# Patient Record
Sex: Female | Born: 1935 | Hispanic: Yes | Marital: Single
Health system: Southern US, Community
[De-identification: ages and names within clinical notes are randomized; demographics above are authoritative.]

---

## 2018-11-01 ENCOUNTER — Observation Stay (HOSPITAL_COMMUNITY)
Admission: EM | Admit: 2018-11-01 | Discharge: 2018-11-03 | Disposition: A | Discharge status: Home / Self Care | Payer: PRIVATE HEALTH INSURANCE | Attending: Internal Medicine | Admitting: Internal Medicine

## 2018-11-01 ENCOUNTER — Emergency Department (HOSPITAL_COMMUNITY): Payer: PRIVATE HEALTH INSURANCE

## 2018-11-01 ENCOUNTER — Other Ambulatory Visit: Payer: Self-pay

## 2018-11-01 DIAGNOSIS — Z79899 Other long term (current) drug therapy: Secondary | ICD-10-CM | POA: Insufficient documentation

## 2018-11-01 DIAGNOSIS — I10 Essential (primary) hypertension: Secondary | ICD-10-CM | POA: Insufficient documentation

## 2018-11-01 DIAGNOSIS — A419 Sepsis, unspecified organism: Secondary | ICD-10-CM | POA: Diagnosis present

## 2018-11-01 DIAGNOSIS — I129 Hypertensive chronic kidney disease with stage 1 through stage 4 chronic kidney disease, or unspecified chronic kidney disease: Secondary | ICD-10-CM | POA: Diagnosis not present

## 2018-11-01 DIAGNOSIS — G309 Alzheimer's disease, unspecified: Secondary | ICD-10-CM | POA: Insufficient documentation

## 2018-11-01 DIAGNOSIS — N183 Chronic kidney disease, stage 3 (moderate): Secondary | ICD-10-CM | POA: Diagnosis not present

## 2018-11-01 DIAGNOSIS — M109 Gout, unspecified: Secondary | ICD-10-CM | POA: Diagnosis not present

## 2018-11-01 DIAGNOSIS — E785 Hyperlipidemia, unspecified: Secondary | ICD-10-CM | POA: Diagnosis not present

## 2018-11-01 DIAGNOSIS — Z1159 Encounter for screening for other viral diseases: Secondary | ICD-10-CM | POA: Insufficient documentation

## 2018-11-01 DIAGNOSIS — F0280 Dementia in other diseases classified elsewhere without behavioral disturbance: Secondary | ICD-10-CM | POA: Diagnosis not present

## 2018-11-01 DIAGNOSIS — I700 Atherosclerosis of aorta: Secondary | ICD-10-CM | POA: Diagnosis not present

## 2018-11-01 DIAGNOSIS — Z7901 Long term (current) use of anticoagulants: Secondary | ICD-10-CM | POA: Insufficient documentation

## 2018-11-01 DIAGNOSIS — I6782 Cerebral ischemia: Secondary | ICD-10-CM | POA: Insufficient documentation

## 2018-11-01 DIAGNOSIS — I447 Left bundle-branch block, unspecified: Secondary | ICD-10-CM | POA: Insufficient documentation

## 2018-11-01 DIAGNOSIS — E1122 Type 2 diabetes mellitus with diabetic chronic kidney disease: Secondary | ICD-10-CM | POA: Diagnosis not present

## 2018-11-01 DIAGNOSIS — N390 Urinary tract infection, site not specified: Secondary | ICD-10-CM | POA: Insufficient documentation

## 2018-11-01 DIAGNOSIS — E119 Type 2 diabetes mellitus without complications: Secondary | ICD-10-CM | POA: Insufficient documentation

## 2018-11-01 DIAGNOSIS — R2681 Unsteadiness on feet: Secondary | ICD-10-CM | POA: Insufficient documentation

## 2018-11-01 LAB — CBC WITH DIFFERENTIAL/PLATELET
Abs Immature Granulocytes: 0.11 10*3/uL — ABNORMAL HIGH (ref 0.00–0.07)
Basophils Absolute: 0 10*3/uL (ref 0.0–0.1)
Basophils Relative: 0 %
Eosinophils Absolute: 0 10*3/uL (ref 0.0–0.5)
Eosinophils Relative: 0 %
HCT: 38.3 % (ref 36.0–46.0)
Hemoglobin: 12 g/dL (ref 12.0–15.0)
Immature Granulocytes: 1 %
Lymphocytes Relative: 4 %
Lymphs Abs: 0.7 10*3/uL (ref 0.7–4.0)
MCH: 27.5 pg (ref 26.0–34.0)
MCHC: 31.3 g/dL (ref 30.0–36.0)
MCV: 87.8 fL (ref 80.0–100.0)
Monocytes Absolute: 1.3 10*3/uL — ABNORMAL HIGH (ref 0.1–1.0)
Monocytes Relative: 8 %
Neutro Abs: 14.2 10*3/uL — ABNORMAL HIGH (ref 1.7–7.7)
Neutrophils Relative %: 87 %
Platelets: 232 10*3/uL (ref 150–400)
RBC: 4.36 MIL/uL (ref 3.87–5.11)
RDW: 13.4 % (ref 11.5–15.5)
WBC: 16.3 10*3/uL — ABNORMAL HIGH (ref 4.0–10.5)
nRBC: 0 % (ref 0.0–0.2)

## 2018-11-01 LAB — URINALYSIS, ROUTINE W REFLEX MICROSCOPIC
Bilirubin Urine: NEGATIVE
Glucose, UA: NEGATIVE mg/dL
Ketones, ur: NEGATIVE mg/dL
Nitrite: NEGATIVE
Protein, ur: NEGATIVE mg/dL
Specific Gravity, Urine: <1.005 — ABNORMAL LOW (ref 1.005–1.030)
pH: 6.5 (ref 5.0–8.0)

## 2018-11-01 LAB — URINALYSIS, MICROSCOPIC (REFLEX)

## 2018-11-01 LAB — LIPASE, BLOOD: Lipase: 58 U/L — ABNORMAL HIGH (ref 11–51)

## 2018-11-01 LAB — COMPREHENSIVE METABOLIC PANEL
ALT: 23 U/L (ref 0–44)
AST: 27 U/L (ref 15–41)
Albumin: 3.6 g/dL (ref 3.5–5.0)
Alkaline Phosphatase: 114 U/L (ref 38–126)
Anion gap: 11 (ref 5–15)
BUN: 15 mg/dL (ref 8–23)
CO2: 21 mmol/L — ABNORMAL LOW (ref 22–32)
Calcium: 8.7 mg/dL — ABNORMAL LOW (ref 8.9–10.3)
Chloride: 102 mmol/L (ref 98–111)
Creatinine, Ser: 0.91 mg/dL (ref 0.44–1.00)
GFR calc Af Amer: >60 mL/min (ref >60)
GFR calc non Af Amer: 58 mL/min — ABNORMAL LOW (ref >60)
Glucose, Bld: 132 mg/dL — ABNORMAL HIGH (ref 70–99)
Potassium: 3.9 mmol/L (ref 3.5–5.1)
Sodium: 134 mmol/L — ABNORMAL LOW (ref 135–145)
Total Bilirubin: 0.4 mg/dL (ref 0.3–1.2)
Total Protein: 7 g/dL (ref 6.5–8.1)

## 2018-11-01 LAB — SARS CORONAVIRUS 2 BY RT PCR (HOSPITAL ORDER, PERFORMED IN ~~LOC~~ HOSPITAL LAB): SARS Coronavirus 2: NEGATIVE

## 2018-11-01 LAB — LACTIC ACID, PLASMA: Lactic Acid, Venous: 2.1 mmol/L (ref 0.5–1.9)

## 2018-11-01 MED ORDER — ACETAMINOPHEN 650 MG RE SUPP
650.0000 mg | Freq: Once | RECTAL | Status: DC
  Filled 2018-11-01: qty 1

## 2018-11-01 MED ORDER — LACTATED RINGERS IV BOLUS
1000.0000 mL | Freq: Once | INTRAVENOUS | Status: AC
  Administered 2018-11-01: 1000 mL via INTRAVENOUS

## 2018-11-01 MED ORDER — ACETAMINOPHEN 325 MG PO TABS
650.0000 mg | ORAL_TABLET | Freq: Once | ORAL | Status: AC
  Administered 2018-11-01: 650 mg via ORAL
  Filled 2018-11-01: qty 2

## 2018-11-01 MED ORDER — SODIUM CHLORIDE 0.9 % IV SOLN
1.0000 g | Freq: Once | INTRAVENOUS | Status: AC
  Administered 2018-11-01: 1 g via INTRAVENOUS
  Filled 2018-11-01: qty 10

## 2018-11-01 MED ORDER — SODIUM CHLORIDE 0.9 % IV SOLN
500.0000 mg | Freq: Once | INTRAVENOUS | Status: AC
  Administered 2018-11-01: 500 mg via INTRAVENOUS
  Filled 2018-11-01: qty 500

## 2018-11-01 NOTE — ED Notes (Signed)
discared blood cultures,  Tech collected 2nd set.

## 2018-11-01 NOTE — ED Triage Notes (Signed)
Pt brought in by EMS with c/o of CP earlier today.  Pt reports dizziness and fever.  Per EMS temp of 101.6.  Pt has hx of confusion

## 2018-11-01 NOTE — ED Provider Notes (Signed)
[facility] Provider Note   CSN: [id number] Arrival date & time: 11/01/18  1955    History   Chief Complaint Chief Complaint  Patient presents with  . Chest Pain  . Fever    HPI Nancy Griffin is a 85 y.o. female.     The history is provided by the patient.  Fever Temp source:  Subjective Severity:  Mild Onset quality:  Gradual Timing:  Constant Progression:  Unchanged Chronicity:  New Relieved by:  Nothing Worsened by:  Nothing Associated symptoms: confusion and dysuria (maybe)   Associated symptoms: no chest pain, no chills, no cough, no ear pain, no rash, no sore throat and no vomiting   Risk factors: no sick contacts     No past medical history on file.  There are no active problems to display for this patient.     OB History   No obstetric history on file.      Home Medications    Prior to Admission medications   Not on File    Family History No family history on file.  Social History Social History   Tobacco Use  . Smoking status: Not on file  Substance Use Topics  . Alcohol use: Not on file  . Drug use: Not on file     Allergies   Patient has no allergy information on record.   Review of Systems Review of Systems  Constitutional: Positive for fever. Negative for chills.  HENT: Negative for ear pain and sore throat.   Eyes: Negative for pain and visual disturbance.  Respiratory: Negative for cough and shortness of breath.   Cardiovascular: Negative for chest pain and palpitations.  Gastrointestinal: Negative for abdominal pain and vomiting.  Genitourinary: Positive for dysuria (maybe). Negative for hematuria.  Musculoskeletal: Negative for arthralgias and back pain.  Skin: Negative for color change and rash.  Neurological: Positive for weakness. Negative for seizures and syncope.  Psychiatric/Behavioral: Positive for confusion.  All other systems reviewed and are negative.     Physical Exam Updated Vital Signs  ED Triage Vitals  Enc Vitals Group     BP 11/01/18 2016 134/75     Pulse Rate 11/01/18 2016 86     Resp 11/01/18 2016 20     Temp 11/01/18 2013 (!) 102.4 F (39.1 C)     Temp Source 11/01/18 2013 Oral     SpO2 11/01/18 2016 96 %     Weight --      Height --      Head Circumference --      Peak Flow --      Pain Score --      Pain Loc --      Pain Edu? --      Excl. in GC? --     Physical Exam Vitals signs and nursing note reviewed.  Constitutional:      General: She is not in acute distress.    Appearance: She is well-developed. She is not ill-appearing.  HENT:     Head: Normocephalic and atraumatic.  Eyes:     Conjunctiva/sclera: Conjunctivae normal.     Pupils: Pupils are equal, round, and reactive to light.  Neck:     Musculoskeletal: Normal range of motion and neck supple.  Cardiovascular:     Rate and Rhythm: Normal rate and regular rhythm.     Pulses:          Radial pulses are 2+ on the right side and  2+ on the left side.     Heart sounds: Normal heart sounds. No murmur.  Pulmonary:     Effort: Pulmonary effort is normal. No respiratory distress.     Breath sounds: Normal breath sounds. No decreased breath sounds, wheezing or rhonchi.  Abdominal:     Palpations: Abdomen is soft.     Tenderness: There is no abdominal tenderness.  Musculoskeletal:     Right lower leg: No edema.     Left lower leg: No edema.  Skin:    General: Skin is warm and dry.     Capillary Refill: Capillary refill takes less than 2 seconds.  Neurological:     General: No focal deficit present.     Mental Status: She is alert.     Comments: Mildly confused      ED Treatments / Results  Labs (all labs ordered are listed, but only abnormal results are displayed) Labs Reviewed  LACTIC ACID, PLASMA - Abnormal; Notable for the following components:      Result Value   Lactic Acid, Venous 2.1 (*)    All other components within normal limits   COMPREHENSIVE METABOLIC PANEL - Abnormal; Notable for the following components:   Sodium 134 (*)    CO2 21 (*)    Glucose, Bld 132 (*)    Calcium 8.7 (*)    GFR calc non Af Amer 58 (*)    All other components within normal limits  CBC WITH DIFFERENTIAL/PLATELET - Abnormal; Notable for the following components:   WBC 16.3 (*)    Neutro Abs 14.2 (*)    Monocytes Absolute 1.3 (*)    Abs Immature Granulocytes 0.11 (*)    All other components within normal limits  URINALYSIS, ROUTINE W REFLEX MICROSCOPIC - Abnormal; Notable for the following components:   APPearance HAZY (*)    Specific Gravity, Urine <1.005 (*)    Hgb urine dipstick TRACE (*)    Leukocytes,Ua MODERATE (*)    All other components within normal limits  LIPASE, BLOOD - Abnormal; Notable for the following components:   Lipase 58 (*)    All other components within normal limits  URINALYSIS, MICROSCOPIC (REFLEX) - Abnormal; Notable for the following components:   Bacteria, UA RARE (*)    All other components within normal limits  SARS CORONAVIRUS 2 (HOSPITAL ORDER, [place] [facility])  CULTURE, BLOOD (ROUTINE X 2)  CULTURE, BLOOD (ROUTINE X 2)  URINE CULTURE  LACTIC ACID, PLASMA    EKG EKG Interpretation  Date/Time:  Saturday November 01 2018 20:09:14 EDT Ventricular Rate:  92 PR Interval:    QRS Duration: 130 QT Interval:  367 QTC Calculation: 454 R Axis:   -53 Text Interpretation:  Sinus rhythm Borderline prolonged PR interval Left bundle branch block Confirmed by Lennice Sites [phone]) on 11/01/2018 8:13:25 PM   Radiology Dg Chest Portable 1 View  Result Date: 11/01/2018 CLINICAL DATA:  Fever EXAM: PORTABLE CHEST 1 VIEW COMPARISON:  None. FINDINGS: No focal airspace disease or effusion. Mild cardiomegaly with aortic atherosclerosis. No pneumothorax. IMPRESSION: No active disease.  Cardiomegaly Electronically Signed   By: Donavan Foil M.D.   On: 11/01/2018 23:24    Procedures .Critical Care  Performed by: Lennice Sites, DO Authorized by: Lennice Sites, DO   Critical care provider statement:    Critical care time (minutes):  35   Critical care was necessary to treat or prevent imminent or life-threatening deterioration of the following conditions:  Sepsis  Critical care was time spent personally by me on the following activities:  Blood draw for specimens, development of treatment plan with patient or surrogate, discussions with primary provider, evaluation of patient's response to treatment, examination of patient, obtaining history from patient or surrogate, ordering and performing treatments and interventions, ordering and review of laboratory studies, ordering and review of radiographic studies, pulse oximetry, re-evaluation of patient's condition and review of old charts   I assumed direction of critical care for this patient from another provider in my specialty: no     (including critical care time)  Medications Ordered in ED Medications  acetaminophen (TYLENOL) suppository 650 mg (650 mg Rectal Not Given 11/01/18 2302)  lactated ringers bolus 1,000 mL (0 mLs Intravenous Stopped 11/01/18 2302)  cefTRIAXone (ROCEPHIN) 1 g in sodium chloride 0.9 % 100 mL IVPB (0 g Intravenous Stopped 11/01/18 2302)  azithromycin (ZITHROMAX) 500 mg in sodium chloride 0.9 % 250 mL IVPB (500 mg Intravenous New Bag/Given 11/01/18 2301)  acetaminophen (TYLENOL) tablet 650 mg (650 mg Oral Given 11/01/18 2139)     Initial Impression / Assessment and Plan / ED Course  I have reviewed the triage vital signs and the nursing notes.  Pertinent labs & imaging results that were available during my care of the patient were reviewed by me and considered in my medical decision making (see chart for details).     Minette HeadlandJosephina Therrell is an 85 year old female with history of possible heart history who presents the ED with weakness, fever, confusion.  Patient with temperature of 102.4.  Mild tachypnea.  Otherwise  normal vitals.  Patient found with a white count of 16.  Code sepsis was initiated.  Patient given IV Rocephin, IV Zithromax.  Patient is Spanish-speaking and interpreter was used.  However patient was little lethargic and difficult to interview at first.  Family member states that fever started today.  Possible UTI.  Did not have any cough.  No concern for coronavirus at home.  Patient with negative coronavirus test.  Urinalysis looks like a UTI.  Chest x-ray with no signs of pneumonia.  Patient with improved mentation following IV fluids and Tylenol.  Lactic acid was 2.1.  Concern for sepsis.  Patient to be admitted to medicine service for further sepsis care.  Improved following IV fluids, Tylenol, antibiotics.  This chart was dictated using voice recognition software.  Despite best efforts to proofread,  errors can occur which can change the documentation meaning.    Final Clinical Impressions(s) / ED Diagnoses   Final diagnoses:  Sepsis, due to unspecified organism, unspecified whether acute organ dysfunction present [facility](HCC)  Urinary tract infection without hematuria, site unspecified    ED Discharge Orders    None       Virgina NorfolkCuratolo, Ashlley Booher, DO 11/02/18 0014

## 2018-11-01 NOTE — ED Triage Notes (Signed)
Pt is very poor historian due to dementia

## 2018-11-02 ENCOUNTER — Observation Stay (HOSPITAL_COMMUNITY): Payer: PRIVATE HEALTH INSURANCE

## 2018-11-02 DIAGNOSIS — N390 Urinary tract infection, site not specified: Secondary | ICD-10-CM | POA: Diagnosis present

## 2018-11-02 DIAGNOSIS — I10 Essential (primary) hypertension: Secondary | ICD-10-CM

## 2018-11-02 DIAGNOSIS — E119 Type 2 diabetes mellitus without complications: Secondary | ICD-10-CM

## 2018-11-02 DIAGNOSIS — I447 Left bundle-branch block, unspecified: Secondary | ICD-10-CM

## 2018-11-02 DIAGNOSIS — A419 Sepsis, unspecified organism: Secondary | ICD-10-CM

## 2018-11-02 LAB — CBC
HCT: 35.8 % — ABNORMAL LOW (ref 36.0–46.0)
Hemoglobin: 11 g/dL — ABNORMAL LOW (ref 12.0–15.0)
MCH: 27 pg (ref 26.0–34.0)
MCHC: 30.7 g/dL (ref 30.0–36.0)
MCV: 88 fL (ref 80.0–100.0)
Platelets: 223 10*3/uL (ref 150–400)
RBC: 4.07 MIL/uL (ref 3.87–5.11)
RDW: 13.7 % (ref 11.5–15.5)
WBC: 14.9 10*3/uL — ABNORMAL HIGH (ref 4.0–10.5)
nRBC: 0 % (ref 0.0–0.2)

## 2018-11-02 LAB — LACTIC ACID, PLASMA: Lactic Acid, Venous: 1.2 mmol/L (ref 0.5–1.9)

## 2018-11-02 LAB — BASIC METABOLIC PANEL
Anion gap: 8 (ref 5–15)
BUN: 12 mg/dL (ref 8–23)
CO2: 23 mmol/L (ref 22–32)
Calcium: 8.5 mg/dL — ABNORMAL LOW (ref 8.9–10.3)
Chloride: 110 mmol/L (ref 98–111)
Creatinine, Ser: 0.83 mg/dL (ref 0.44–1.00)
GFR calc Af Amer: >60 mL/min (ref >60)
GFR calc non Af Amer: >60 mL/min (ref >60)
Glucose, Bld: 115 mg/dL — ABNORMAL HIGH (ref 70–99)
Potassium: 3.9 mmol/L (ref 3.5–5.1)
Sodium: 141 mmol/L (ref 135–145)

## 2018-11-02 LAB — TROPONIN I (HIGH SENSITIVITY): Troponin I (High Sensitivity): 14 ng/L (ref <18)

## 2018-11-02 LAB — GLUCOSE, CAPILLARY
Glucose-Capillary: 107 mg/dL — ABNORMAL HIGH (ref 70–99)
Glucose-Capillary: 102 mg/dL — ABNORMAL HIGH (ref 70–99)
Glucose-Capillary: 94 mg/dL (ref 70–99)
Glucose-Capillary: 99 mg/dL (ref 70–99)

## 2018-11-02 LAB — HEMOGLOBIN A1C
Hgb A1c MFr Bld: 5.4 % (ref 4.8–5.6)
Mean Plasma Glucose: 108.28 mg/dL

## 2018-11-02 LAB — PROTIME-INR
INR: 2.4 — ABNORMAL HIGH (ref 0.8–1.2)
Prothrombin Time: 26.1 seconds — ABNORMAL HIGH (ref 11.4–15.2)

## 2018-11-02 MED ORDER — ASPIRIN EC 81 MG PO TBEC
81.0000 mg | DELAYED_RELEASE_TABLET | Freq: Every day | ORAL | Status: DC
  Administered 2018-11-02 – 2018-11-03 (×2): 81 mg via ORAL
  Filled 2018-11-02 (×2): qty 1

## 2018-11-02 MED ORDER — WARFARIN - PHARMACIST DOSING INPATIENT
Freq: Every day | Status: DC
  Administered 2018-11-02: 18:00:00

## 2018-11-02 MED ORDER — SODIUM CHLORIDE 0.9 % IV SOLN
INTRAVENOUS | Status: DC
  Administered 2018-11-02: 03:00:00 via INTRAVENOUS

## 2018-11-02 MED ORDER — MIRTAZAPINE 15 MG PO TABS
15.0000 mg | ORAL_TABLET | Freq: Every day | ORAL | Status: DC
  Administered 2018-11-02: 15 mg via ORAL
  Filled 2018-11-02: qty 1

## 2018-11-02 MED ORDER — ACETAMINOPHEN 325 MG PO TABS: 650.0000 mg | ORAL_TABLET | Freq: Four times a day (QID) | ORAL | Status: DC | PRN

## 2018-11-02 MED ORDER — ENOXAPARIN SODIUM 40 MG/0.4ML ~~LOC~~ SOLN: 40.0000 mg | Freq: Every day | SUBCUTANEOUS | Status: DC

## 2018-11-02 MED ORDER — INSULIN ASPART 100 UNIT/ML ~~LOC~~ SOLN: 0.0000 [IU] | Freq: Every day | SUBCUTANEOUS | Status: DC

## 2018-11-02 MED ORDER — ACETAMINOPHEN 650 MG RE SUPP: 650.0000 mg | Freq: Four times a day (QID) | RECTAL | Status: DC | PRN

## 2018-11-02 MED ORDER — MEMANTINE HCL ER 28 MG PO CP24
28.0000 mg | ORAL_CAPSULE | Freq: Every day | ORAL | Status: DC
  Administered 2018-11-02 – 2018-11-03 (×2): 28 mg via ORAL
  Filled 2018-11-02 (×2): qty 1

## 2018-11-02 MED ORDER — DONEPEZIL HCL 5 MG PO TABS
5.0000 mg | ORAL_TABLET | Freq: Every day | ORAL | Status: DC
  Administered 2018-11-02: 5 mg via ORAL
  Filled 2018-11-02: qty 1

## 2018-11-02 MED ORDER — SODIUM CHLORIDE 0.9 % IV SOLN
1.0000 g | INTRAVENOUS | Status: DC
  Administered 2018-11-03: 1 g via INTRAVENOUS
  Filled 2018-11-02: qty 10

## 2018-11-02 MED ORDER — GALANTAMINE HYDROBROMIDE 4 MG PO TABS
4.0000 mg | ORAL_TABLET | Freq: Two times a day (BID) | ORAL | Status: DC
  Filled 2018-11-02: qty 1

## 2018-11-02 MED ORDER — PANTOPRAZOLE SODIUM 40 MG PO TBEC
40.0000 mg | DELAYED_RELEASE_TABLET | Freq: Every day | ORAL | Status: DC
  Administered 2018-11-02 – 2018-11-03 (×2): 40 mg via ORAL
  Filled 2018-11-02 (×2): qty 1

## 2018-11-02 MED ORDER — QUETIAPINE FUMARATE 25 MG PO TABS
25.0000 mg | ORAL_TABLET | Freq: Every evening | ORAL | Status: DC
  Administered 2018-11-02: 25 mg via ORAL
  Filled 2018-11-02: qty 1

## 2018-11-02 MED ORDER — INSULIN ASPART 100 UNIT/ML ~~LOC~~ SOLN: 0.0000 [IU] | Freq: Three times a day (TID) | SUBCUTANEOUS | Status: DC

## 2018-11-02 MED ORDER — WARFARIN SODIUM 3 MG PO TABS
3.0000 mg | ORAL_TABLET | Freq: Once | ORAL | Status: AC
  Administered 2018-11-02: 3 mg via ORAL
  Filled 2018-11-02: qty 1

## 2018-11-02 NOTE — Progress Notes (Signed)
Urine culture was obtained for patient and sent to the lab.

## 2018-11-02 NOTE — Evaluation (Signed)
Physical Therapy Evaluation Patient Details Name: Nancy Griffin MRN: [id number] DOB: 1931-09-04 Today's Date: 11/02/2018   History of Present Illness  Nancy Griffin is a 85 y.o.spanish speaking female with no past medical history on file presenting to the hospital via EMS for evaluation of fever and confusion.  Presumed to have UTI.   Clinical Impression  Pt admitted with above diagnosis. Pt currently with functional limitations due to the deficits listed below (see PT Problem List). Pt was able to ambulate without device and with fairly steady gait overall although at times pt reaching for furniture.  Given confusion, if she has 24 hour care, will be safe to go home but if she does not may need SNF.  She was unaware of month and year as well as the fact that she was in hospital.  Will follow acutely.  Pt will benefit from skilled PT to increase their independence and safety with mobility to allow discharge to the venue listed below.      Follow Up Recommendations Home health PT;Supervision/Assistance - 24 hour(If pt does not have 24 hour care, may need SNF)    Equipment Recommendations  Rolling walker with 5" wheels    Recommendations for Other Services       Precautions / Restrictions Precautions Precautions: Fall Restrictions Weight Bearing Restrictions: No      Mobility  Bed Mobility Overal bed mobility: Independent                Transfers Overall transfer level: Independent                  Ambulation/Gait Ambulation/Gait assistance: Min guard Gait Distance (Feet): 150 Feet Assistive device: None Gait Pattern/deviations: Step-through pattern;Decreased stride length   Gait velocity interpretation: <1.31 ft/sec, indicative of household ambulator General Gait Details: Pt generally steady at times reaching for furniture in room.  Feel that confusion hinders pts safety.    Stairs            Wheelchair Mobility    Modified Rankin (Stroke  Patients Only)       Balance Overall balance assessment: Needs assistance Sitting-balance support: No upper extremity supported;Feet supported Sitting balance-Leahy Scale: Fair     Standing balance support: No upper extremity supported;During functional activity Standing balance-Leahy Scale: Fair Standing balance comment: can stand statically and wiped self after using bathroom as well as able to stand and wash hands at sink                             Pertinent Vitals/Pain Pain Assessment: No/denies pain    Home Living Family/patient expects to be discharged to:: Private residence Living Arrangements: Spouse/significant other Available Help at Discharge: Family;Available 24 hours/day Type of Home: Apartment Home Access: Level entry     Home Layout: One level Home Equipment: None      Prior Function Level of Independence: Independent               Hand Dominance        Extremity/Trunk Assessment   Upper Extremity Assessment Upper Extremity Assessment: Defer to OT evaluation    Lower Extremity Assessment Lower Extremity Assessment: Generalized weakness    Cervical / Trunk Assessment Cervical / Trunk Assessment: Normal  Communication   Communication: Prefers language other than English(Interpreter Jomarie LongsJoseph [phone])  Cognition Arousal/Alertness: Awake/alert Behavior During Therapy: WFL for tasks assessed/performed Overall Cognitive Status: History of cognitive impairments - at baseline Area of Impairment: Orientation;Safety/judgement;Problem  solving                 Orientation Level: Disoriented to;Place;Time;Situation       Safety/Judgement: Decreased awareness of safety;Decreased awareness of deficits   Problem Solving: Slow processing;Decreased initiation General Comments: Pt did not know she was in hospital or the month or year.       General Comments      Exercises     Assessment/Plan    PT Assessment Patient needs  continued PT services  PT Problem List Decreased activity tolerance;Decreased balance;Decreased mobility;Decreased knowledge of use of DME;Decreased safety awareness;Decreased knowledge of precautions       PT Treatment Interventions DME instruction;Gait training;Functional mobility training;Therapeutic activities;Therapeutic exercise;Balance training;Patient/family education    PT Goals (Current goals can be found in the Care Plan section)  Acute Rehab PT Goals Patient Stated Goal: to go home PT Goal Formulation: With patient Time For Goal Achievement: 11/16/18 Potential to Achieve Goals: Good    Frequency Min 3X/week   Barriers to discharge        Co-evaluation               AM-PAC PT "6 Clicks" Mobility  Outcome Measure Help needed turning from your back to your side while in a flat bed without using bedrails?: None Help needed moving from lying on your back to sitting on the side of a flat bed without using bedrails?: None Help needed moving to and from a bed to a chair (including a wheelchair)?: None Help needed standing up from a chair using your arms (e.g., wheelchair or bedside chair)?: None Help needed to walk in hospital room?: A Little Help needed climbing 3-5 steps with a railing? : A Little 6 Click Score: 22    End of Session Equipment Utilized During Treatment: Gait belt Activity Tolerance: Patient tolerated treatment well Patient left: in chair;with call bell/phone within reach;with chair alarm set Nurse Communication: Mobility status PT Visit Diagnosis: Unsteadiness on feet (R26.81);Muscle weakness (generalized) (M62.81)    Time: 5053-9767 PT Time Calculation (min) (ACUTE ONLY): 25 min   Charges:   PT Evaluation $PT Eval Moderate Complexity: 1 Mod PT Treatments $Gait Training: 8-22 mins        [place] Pager:  [phone]  Office:  [place] 11/02/2018, 3:10 PM

## 2018-11-02 NOTE — ED Notes (Addendum)
Daughter's (speaks english) number is [phone] and would like to be contacted with ANY and ALL updates. Her name is Regino Schultze.

## 2018-11-02 NOTE — Plan of Care (Signed)
Progressing

## 2018-11-02 NOTE — Progress Notes (Addendum)
Patient is having increasing disorientation, needing constant staff supervision for safety.  Patient refusing to use call bell, and wandering around in room.  Patient is unsteady on feet and refusing to use walker for balance.  Also, refusing to sit in chair or lie in bed.  Patient is looking at floor for objects and playing with sheets on bed in a repetitive manner. Notified provider, Dr. Berle Mull, MD; provider made aware of patient behaviors and mental status.

## 2018-11-02 NOTE — Progress Notes (Signed)
Called family (pt's daughter, Regino Schultze) to provide update on pt.  Provide update on patient and answered questions regarding hospital visitation policy.

## 2018-11-02 NOTE — H&P (Addendum)
History and Physical    Atonya Templer UXN:[id number] DOB: [birth year] DOA: 11/01/2018  PCP: Patient, No Pcp Per Patient coming from: Home  Chief Complaint: Fever, confusion  HPI: Bernette Mayers Coia is a 85 y.o.spanish speaking female with no past medical history on file presenting to the hospital via EMS for evaluation of fever and confusion.  Spanish interpreter services used.  History obtained both from the patient and her daughter over the phone.  Patient states she has not been feeling well.  She had a fever and was feeling cold.  Denies any dysuria, urinary frequency, or urgency.  Denies any chest pain, shortness of breath, cough, nausea, vomiting, abdominal pain, or diarrhea.  She has no other complaints.  Daughter states patient is from [place] and is currently living with her here.  Her physician and medical records are in [place].  Daughter states patient has a history of dementia, hypertension, diabetes, and a heart problem for which she takes a blood thinner.  She does not have the list of her medications available at this time.  States patient was more confused today and her speech was incoherent.  She did not notice any facial droop or focal motor weakness.  Patient has not complained of any leg pain.  Daughter states she has seen the patient in the emergency room and feels after receiving treatment for her infection she is back to her baseline.    ED Course: Temperature 102.4 F, remainder vital stable.  COVID-19 rapid test negative.  White count 16.3 with left shift. Lactic acid 2.1. Lipase 58, LFTs normal.  UA with moderate amount of leukocytes, 11-20 WBCs, and rare bacteria.  Urine culture pending.  Blood culture x2 pending.  Chest x-ray showing mild cardiomegaly and no active disease. Patient received 1 L fluid bolus, ceftriaxone, and azithromycin in the ED.  Review of Systems:  All systems reviewed and apart from history of presenting illness, are negative.  Past medical history:  Dementia, hypertension, diabetes, heart problem  Past surgical history: Neck vein surgery  Social history: No tobacco, ethanol, or illicit drug use.  Family history: Father had leukemia.  Prior to Admission medications   Not on File    Physical Exam: Vitals:   11/01/18 2230 11/01/18 2330 11/02/18 0030 11/02/18 0100  BP: 128/77 (!) 107/54 (!) 104/57 (!) 115/52  Pulse: 85 74 71 65  Resp: 20 19 15  (!) 21  Temp:      TempSrc:      SpO2: 92% 96% 95% 96%    Physical Exam  Constitutional: She appears well-developed and well-nourished. No distress.  HENT:  Head: Normocephalic.  Dry mucous membranes  Eyes: Pupils are equal, round, and reactive to light. EOM are normal. Right eye exhibits no discharge. Left eye exhibits no discharge.  Neck: Neck supple.  Cardiovascular: Normal rate, regular rhythm and intact distal pulses.  Pulmonary/Chest: Effort normal and breath sounds normal. No respiratory distress. She has no wheezes. She has no rales.  Abdominal: Soft. Bowel sounds are normal. She exhibits no distension. There is abdominal tenderness. There is guarding.  Musculoskeletal:        General: No edema.     Comments: Area of mild erythema noted on lateral aspect of the right lower extremity in the mid calf region.  No swelling or increased warmth to touch.  Neurological:  Awake and alert. Oriented to self only. Speech fluent, tongue midline, no facial droop. Strength 5 out of 5 in bilateral upper and lower extremities. Sensation  to light touch intact throughout.  Skin: She is not diaphoretic.     Labs on Admission: I have personally reviewed following labs and imaging studies  CBC: Recent Labs  Lab 11/01/18 2011  WBC 16.3*  NEUTROABS 14.2*  HGB 12.0  HCT 38.3  MCV 87.8  PLT 232   Basic Metabolic Panel: Recent Labs  Lab 11/01/18 2011  NA 134*  K 3.9  CL 102  CO2 21*  GLUCOSE 132*  BUN 15  CREATININE 0.91  CALCIUM 8.7*   GFR: CrCl cannot be calculated  (Unknown ideal weight.). Liver Function Tests: Recent Labs  Lab 11/01/18 2011  AST 27  ALT 23  ALKPHOS 114  BILITOT 0.4  PROT 7.0  ALBUMIN 3.6   Recent Labs  Lab 11/01/18 2011  LIPASE 58*   No results for input(s): AMMONIA in the last 168 hours. Coagulation Profile: No results for input(s): INR, PROTIME in the last 168 hours. Cardiac Enzymes: No results for input(s): CKTOTAL, CKMB, CKMBINDEX, TROPONINI in the last 168 hours. BNP (last 3 results) No results for input(s): PROBNP in the last 8760 hours. HbA1C: No results for input(s): HGBA1C in the last 72 hours. CBG: No results for input(s): GLUCAP in the last 168 hours. Lipid Profile: No results for input(s): CHOL, HDL, LDLCALC, TRIG, CHOLHDL, LDLDIRECT in the last 72 hours. Thyroid Function Tests: No results for input(s): TSH, T4TOTAL, FREET4, T3FREE, THYROIDAB in the last 72 hours. Anemia Panel: No results for input(s): VITAMINB12, FOLATE, FERRITIN, TIBC, IRON, RETICCTPCT in the last 72 hours. Urine analysis:    Component Value Date/Time   COLORURINE YELLOW 11/01/2018 2310   APPEARANCEUR HAZY (A) 11/01/2018 2310   LABSPEC <1.005 (L) 11/01/2018 2310   PHURINE 6.5 11/01/2018 2310   GLUCOSEU NEGATIVE 11/01/2018 2310   HGBUR TRACE (A) 11/01/2018 2310   BILIRUBINUR NEGATIVE 11/01/2018 2310   KETONESUR NEGATIVE 11/01/2018 2310   PROTEINUR NEGATIVE 11/01/2018 2310   NITRITE NEGATIVE 11/01/2018 2310   LEUKOCYTESUR MODERATE (A) 11/01/2018 2310    Radiological Exams on Admission: Dg Chest Portable 1 View  Result Date: 11/01/2018 CLINICAL DATA:  Fever EXAM: PORTABLE CHEST 1 VIEW COMPARISON:  None. FINDINGS: No focal airspace disease or effusion. Mild cardiomegaly with aortic atherosclerosis. No pneumothorax. IMPRESSION: No active disease.  Cardiomegaly Electronically Signed   By: Jasmine PangKim  Fujinaga M.D.   On: 11/01/2018 23:24    EKG: Independently reviewed.  Sinus rhythm, borderline prolonged PR interval, LBBB.  No prior EKG  for comparison.  Assessment/Plan Principal Problem:   UTI (urinary tract infection) Active Problems:   Sepsis (HCC)   LBBB (left bundle branch block)   Diabetes (HCC)   HTN (hypertension)   Sepsis secondary to UTI, possible right lower extremity cellulitis Temperature 102.4 F, remainder vital stable.  White count 16.3 with left shift.   Lactic acid 2.1. UA with moderate amount of leukocytes, 11-20 WBCs, and rare bacteria. Area of mild erythema noted on lateral aspect of the right lower extremity in the mid calf region.  No swelling or increased warmth to touch.  No leg pain. -IV fluid -Continue ceftriaxone -Tylenol PRN -Urine culture pending -Blood culture x2 pending -Continue to trend lactate  Incoherent speech/confusion Per daughter, patient has baseline dementia but was confused and her speech was incoherent earlier today.  No facial droop or focal weakness noted.  Daughter feels patient is back to her baseline now.  Neuro exam nonfocal. -Head CT not done in the ED.  Will order.  LBBB on EKG No prior  EKG for comparison.  Patient denies any chest pain and appears comfortable on exam.  Daughter mentions she has a history of a heart problem.  [place]. -Cardiac monitoring -Check high-sensitivity troponin  Diabetes Unclear whether patient is on insulin or an oral hypoglycemic agent. -Check A1c.  Sliding scale sensitive and CBG checks.  Hypertension -Hold antihypertensives at this time in the setting of sepsis  Unable to safely order home medications at this time as pharmacy medication reconciliation is pending.  DVT prophylaxis: Lovenox if head CT negative for hemorrhage Code Status: Full code.  Discussed with the patient's daughter. Family Communication: Spoke to the patient's daughter Venita SheffieldGladys over the phone. Disposition Plan: Anticipate discharge after clinical improvement. Consults called: None Admission status: It is my clinical opinion that  referral for OBSERVATION is reasonable and necessary in this patient based on the above information provided. The aforementioned taken together are felt to place the patient at high risk for further clinical deterioration. However it is anticipated that the patient may be medically stable for discharge from the hospital within 24 to 48 hours.  The medical decision making on this patient was of high complexity and the patient is at high risk for clinical deterioration, therefore this is a level 3 visit.  John GiovanniVasundhra Markez Dowland MD [facility] Pager [phone]  If 7PM-7AM, please contact night-coverage [email or web address] Password [email or web address]  11/02/2018, 1:40 AM

## 2018-11-02 NOTE — Progress Notes (Signed)
[facility] PLAN OF CARE NOTE Patient: Nancy Griffin RVU:[id number]   PCP: Patient, No Pcp Per DOB: March 18, 1932   DOA: 11/01/2018   DOS: 11/02/2018    Patient was admitted by my colleague Dr. Marlowe Sax earlier on 11/02/2018. I have reviewed the H&P as well as assessment and plan and agree with the same. Important changes in the plan are listed below.  Plan of care: Principal Problem:   UTI (urinary tract infection) Active Problems:   Sepsis ([place])   LBBB (left bundle branch block)   Diabetes (HCC)   HTN (hypertension) Latent tuberculosis b12 def htn norvasc 10 toprol 25 HLD crestor 10 Gout colchicine PRN RA arava and prednisone Warfarin 3 mg on warfarin 1.5 mg Tues/Thurs/Sat; 3 mg all other days INR for goal of 2-3 celexa 10 alz dementia  CKD 3 Troponin   Author: Berle Mull, MD [facility] 11/02/2018 6:46 PM   If 7PM-7AM, please contact night-coverage at [email or web address]

## 2018-11-02 NOTE — Progress Notes (Signed)
Used [employer] Interpretor to explain to patient care needs, including nursing assessment an explanation of medications.

## 2018-11-02 NOTE — Progress Notes (Signed)
Used Stratus Interpretor to explain to patient care needs, including nursing assessment and explanation of medications. 

## 2018-11-02 NOTE — Progress Notes (Signed)
Patient had urine in hat in bathroom toilet of 300 ml but it had toilet tissue in hat.  The void occurrence was unwitnessed by nurse. Sample was unusable for urine culture.  Will continue to await appropriate sample for culture.

## 2018-11-02 NOTE — Progress Notes (Signed)
Used Stratus Interpretor to explain to patient care needs, including nursing assessment and explanation of medications.

## 2018-11-02 NOTE — Progress Notes (Signed)
ANTICOAGULATION CONSULT NOTE - Initial Consult  Pharmacy Consult for Coumadin Indication: arrhythmia   Not on File  Patient Measurements: Weight: 125 lb 14.1 oz (57.1 kg)  Vital Signs: Temp: 97.7 F (36.5 C) (07/05 1127) Temp Source: Oral (07/05 1127) BP: 151/54 (07/05 1127) Pulse Rate: 78 (07/05 1127)  Labs: Recent Labs    11/01/18 2011 11/02/18 0106 11/02/18 0435 11/02/18 0749  HGB 12.0  --  11.0*  --   HCT 38.3  --  35.8*  --   PLT 232  --  223  --   LABPROT  --   --   --  26.1*  INR  --   --   --  2.4*  CREATININE 0.91  --  0.83  --   TROPONINIHS  --  14  --   --     CrCl cannot be calculated (Unknown ideal weight.).   Medical History: No past medical history on file.  Assessment: CC/HPI: fever, confusion, 102.4 F, COVID-19 rapid test negative  PMH: dementia, hypertension, diabetes, and a heart problem for which she takes a blood thinner.    Significant events: Patient does not speak English, only Spanish  Anticoag: Coumadin PTA. INR 2.4 today. Hgb 12>11. Plts WNL - Coumadin dose PTA (awaiting med rec, 3mg  tablets filled)  Goal of Therapy:  INR 2-3 Monitor platelets by anticoagulation protocol: Yes   Plan:  Daily INR Coumadin 3mg  po x 1 tonight F/u med rec for home Coumadin dose.  Gabriel Paulding S. Alford Highland, PharmD, [facility] Clinical Staff Pharmacist Eilene Ghazi Stillinger 11/02/2018,2:30 PM

## 2018-11-03 LAB — URINE CULTURE
Culture: NO GROWTH
Special Requests: NORMAL

## 2018-11-03 LAB — GLUCOSE, CAPILLARY
Glucose-Capillary: 112 mg/dL — ABNORMAL HIGH (ref 70–99)
Glucose-Capillary: 84 mg/dL (ref 70–99)
Glucose-Capillary: 85 mg/dL (ref 70–99)

## 2018-11-03 LAB — PROTIME-INR
INR: 2.1 — ABNORMAL HIGH (ref 0.8–1.2)
Prothrombin Time: 23.5 seconds — ABNORMAL HIGH (ref 11.4–15.2)

## 2018-11-03 MED ORDER — WARFARIN SODIUM 3 MG PO TABS: 3.0000 mg | ORAL_TABLET | ORAL | Status: DC

## 2018-11-03 MED ORDER — WARFARIN SODIUM 1 MG PO TABS: 1.5000 mg | ORAL_TABLET | ORAL | Status: DC

## 2018-11-03 MED ORDER — CEPHALEXIN 500 MG PO CAPS: 500.0000 mg | ORAL_CAPSULE | Freq: Two times a day (BID) | ORAL | 0 refills | Status: AC

## 2018-11-03 MED ORDER — CEPHALEXIN 250 MG PO CAPS
500.0000 mg | ORAL_CAPSULE | Freq: Two times a day (BID) | ORAL | Status: DC
  Administered 2018-11-03: 500 mg via ORAL
  Filled 2018-11-03: qty 2

## 2018-11-03 NOTE — Progress Notes (Addendum)
ANTICOAGULATION CONSULT NOTE - f/u Consult  Pharmacy Consult for Coumadin Indication: arrhythmia   No Known Allergies  Patient Measurements: Weight: 128 lb 1.6 oz (58.1 kg)  Vital Signs: Temp: 99.4 F (37.4 C) (07/06 0550) Temp Source: Oral (07/06 0550) BP: 164/69 (07/06 0550) Pulse Rate: 88 (07/06 0550)  Labs: Recent Labs    11/01/18 2011 11/02/18 0106 11/02/18 0435 11/02/18 0749 11/03/18 0542  HGB 12.0  --  11.0*  --   --   HCT 38.3  --  35.8*  --   --   PLT 232  --  223  --   --   LABPROT  --   --   --  26.1* 23.5*  INR  --   --   --  2.4* 2.1*  CREATININE 0.91  --  0.83  --   --   TROPONINIHS  --  14  --   --   --     CrCl cannot be calculated (Unknown ideal weight.).   Medical History: No past medical history on file.  Assessment: Anticoag: Coumadin PTA. INR 2.4>2.1 today. Hgb 12>11. Plts WNL - Coumadin dose PTA 1.5mg  TTSat, 3mg  MWFSun   Goal of Therapy:  INR 2-3 Monitor platelets by anticoagulation protocol: Yes   Plan:  Daily INR Resume home Coumadin regimen F/u to resume home meds: Norvasc, Vit D, Celexa, Colchicine, Cyproheptadine, FESO4, Galantamine, Arava, Toprol, Crestor  Kirsti Mcalpine S. Alford Highland, PharmD, BCPS Clinical Staff Pharmacist Eilene Ghazi Stillinger 11/03/2018,10:10 AM

## 2018-11-03 NOTE — Progress Notes (Signed)
Physical Therapy Treatment Patient Details Name: Nancy Griffin MRN: [id number] DOB: 07-28-1931 Today's Date: 11/03/2018    History of Present Illness Nancy Griffin is a 85 y.o.spanish speaking female with no past medical history on file presenting to the hospital via EMS for evaluation of fever and confusion.  Presumed to have UTI.     PT Comments    Pt very pleasant in chair on arrival with nurse tech Julian interpreting during session. Pt not oriented with decreased safety awareness but demonstrates significantly improved gait, mobility and function. Pt reports she lives with spouse and is independent but unaware of city and states she is from [facility] Will follow to maximize gait and stairs but anticipate pt is very near her baseline. Recommend daily mobility with nursing supervision.     Follow Up Recommendations  No PT follow up;Supervision/Assistance - 24 hour     Equipment Recommendations  None recommended by PT    Recommendations for Other Services       Precautions / Restrictions Precautions Precautions: Fall    Mobility  Bed Mobility Overal bed mobility: Independent                Transfers Overall transfer level: Independent                  Ambulation/Gait Ambulation/Gait assistance: Supervision Gait Distance (Feet): 400 Feet Assistive device: None Gait Pattern/deviations: Shuffle;Trunk flexed;Narrow base of support   Gait velocity interpretation: >2.62 ft/sec, indicative of community ambulatory General Gait Details: pt insistent on wearing slippers for gait but with slippers using shuffling slow gait pattern with cues for increased stride. Removed slippers with improved stride and speed and pt encouraged to utilize gripper socks only unless shoes with back available. pt reports gait very near her baseline   Chief Strategy Officer    Modified Rankin (Stroke Patients Only)       Balance Overall balance  assessment: No apparent balance deficits (not formally assessed)                                          Cognition Arousal/Alertness: Awake/alert Behavior During Therapy: WFL for tasks assessed/performed Overall Cognitive Status: Impaired/Different from baseline Area of Impairment: Orientation;Memory                 Orientation Level: Disoriented to;Place;Time;Situation       Safety/Judgement: Decreased awareness of safety;Decreased awareness of deficits     General Comments: pt aware she is in the hospital but not the name, not oriented to city or situation      Exercises      General Comments        Pertinent Vitals/Pain Pain Assessment: No/denies pain    Home Living                      Prior Function            PT Goals (current goals can now be found in the care plan section) Progress towards PT goals: Progressing toward goals    Frequency           PT Plan Discharge plan needs to be updated    Co-evaluation              AM-PAC PT "6 Clicks" Mobility   Outcome Measure  Help needed turning from your back to your side while in a flat bed without using bedrails?: None Help needed moving from lying on your back to sitting on the side of a flat bed without using bedrails?: None Help needed moving to and from a bed to a chair (including a wheelchair)?: None Help needed standing up from a chair using your arms (e.g., wheelchair or bedside chair)?: None Help needed to walk in hospital room?: A Little Help needed climbing 3-5 steps with a railing? : A Little 6 Click Score: 22    End of Session   Activity Tolerance: Patient tolerated treatment well Patient left: in bed;with call bell/phone within reach;with bed alarm set Nurse Communication: Mobility status PT Visit Diagnosis: Other abnormalities of gait and mobility (R26.89)     Time: 1050-1110 PT Time Calculation (min) (ACUTE ONLY): 20 min  Charges:  $Gait  Training: 8-22 mins                     Heide Brossart Abner Greenspanabor Kaedan Richert, PT Acute Rehabilitation Services Pager: [phone] Office: [phone]    Chanee Henrickson B Kanon Novosel 11/03/2018, 1:05 PM

## 2018-11-03 NOTE — Discharge Instructions (Signed)

## 2018-11-03 NOTE — Progress Notes (Signed)
Spoke to [facility] who will come to get pt, I updated on only new med was antibiotic and her avs will have when her meds are due,a ll questions answered

## 2018-11-06 LAB — CULTURE, BLOOD (ROUTINE X 2)
Culture: NO GROWTH
Culture: NO GROWTH
Special Requests: ADEQUATE
Special Requests: ADEQUATE

## 2018-11-06 NOTE — Discharge Summary (Signed)
[facility] Discharge Summary   Patient: Nancy Griffin JXB:[id number]   PCP: Patient, No Pcp Per DOB: 1931-11-19   Date of admission: 11/01/2018   Date of discharge: 11/03/2018    Discharge Diagnoses:  Principal Problem:   UTI (urinary tract infection) Active Problems:   Sepsis (HCC)   LBBB (left bundle branch block)   Diabetes (HCC)   HTN (hypertension)  Admitted From: home Disposition:  Home with home health  Recommendations for Outpatient Follow-up:  1. Please follow up with PCP in 1 week.  Follow-up Information    Primary care physician. Schedule an appointment as soon as possible for a visit in 1 week(s).          Diet recommendation: regular diet  Activity: The patient is advised to gradually reintroduce usual activities,as tolerated .  Discharge Condition: good  Code Status: full code  History of present illness: As per the H and P dictated on admission, "[name] is a 85 y.o.spanish speaking female with no past medical history on file presenting to the hospital via EMS for evaluation of fever and confusion.  Spanish interpreter services used.  History obtained both from the patient and her daughter over the phone.  Patient states she has not been feeling well.  She had a fever and was feeling cold.  Denies any dysuria, urinary frequency, or urgency.  Denies any chest pain, shortness of breath, cough, nausea, vomiting, abdominal pain, or diarrhea.  She has no other complaints.  Daughter states patient is from [place] and is currently living with her here.  Her physician and medical records are in [place].  Daughter states patient has a history of dementia, hypertension, diabetes, and a heart problem for which she takes a blood thinner.  She does not have the list of her medications available at this time.  States patient was more confused today and her speech was incoherent.  She did not notice any facial droop or focal motor weakness.  Patient has not  complained of any leg pain.  Daughter states she has seen the patient in the emergency room and feels after receiving treatment for her infection she is back to her baseline."  Hospital Course:  Summary of her active problems in the hospital is as following. Sepsis secondary to UTI, Cellulitis ruled out.  Temperature 102.4 F, remainder vital stable.  White count 16.3 with left shift.   Lactic acid 2.1. UA with moderate amount of leukocytes, 11-20 WBCs, and rare bacteria. Area of mild erythema noted on lateral aspect of the right lower extremity in the mid calf region.  No swelling or increased warmth to touch.  No leg pain. -Continue Antibiotics , ceftriaxone switched to keflex. -Urine culture no growth -Blood culture no growth -Continue to trend lactate  Incoherent speech/confusion Per daughter, patient has baseline dementia but was confused and her speech was incoherent earlier today.  No facial droop or focal weakness noted.  Daughter feels patient is back to her baseline now.  Neuro exam nonfocal. -Head CT unremarkable.   LBBB on EKG No prior EKG for comparison.  Patient denies any chest pain and appears comfortable on exam.  Daughter mentions she has a history of a heart problem.  Her medical records are in [place]. - unremarkable Cardiac monitoring  Diabetes type 2 controlled Unclear whether patient is on insulin or an oral hypoglycemic agent. Diet controlled.  Hypertension Resume   Patient was seen by physical therapy, who recommended Home health, which was arranged  by case Production designer, theatre/television/filmmanager. On the day of the discharge the patient's vitals were stable, and no other acute medical condition were reported by patient. the patient was felt safe to be discharge at Home with Home health.  Consultants: none Procedures: none  DISCHARGE MEDICATION: Allergies as of 11/03/2018   No Known Allergies     Medication List    TAKE these medications   acetaminophen 500 MG tablet Commonly  known as: TYLENOL Take 500 mg by mouth every 6 (six) hours as needed for fever or headache (pain).   alendronate 70 MG tablet Commonly known as: FOSAMAX Take 70 mg by mouth every Monday.   amLODipine 10 MG tablet Commonly known as: NORVASC Take 10 mg by mouth daily.   aspirin EC 81 MG tablet Take 81 mg by mouth daily.   cephALEXin 500 MG capsule Commonly known as: KEFLEX Take 1 capsule (500 mg total) by mouth 2 (two) times daily for 3 days.   cholecalciferol 25 MCG (1000 UT) tablet Commonly known as: VITAMIN D Take 1,000 Units by mouth daily.   citalopram 10 MG tablet Commonly known as: CELEXA Take 10 mg by mouth daily.   colchicine 0.6 MG tablet Take 0.6 mg by mouth daily.   cyproheptadine 4 MG tablet Commonly known as: PERIACTIN Take 4 mg by mouth at bedtime.   donepezil 5 MG tablet Commonly known as: ARICEPT Take 5 mg by mouth at bedtime.   FeroSul 325 (65 FE) MG tablet Generic drug: ferrous sulfate Take 325 mg by mouth 2 (two) times a day.   galantamine 4 MG tablet Commonly known as: RAZADYNE Take 4 mg by mouth daily with breakfast.   leflunomide 20 MG tablet Commonly known as: ARAVA Take 20 mg by mouth daily.   memantine 28 MG Cp24 24 hr capsule Commonly known as: NAMENDA XR Take 28 mg by mouth daily.   metoprolol succinate 25 MG 24 hr tablet Commonly known as: TOPROL-XL Take 25 mg by mouth daily. For blood pressure   mirtazapine 15 MG tablet Commonly known as: REMERON Take 15 mg by mouth at bedtime.   omeprazole 20 MG capsule Commonly known as: PRILOSEC Take 20 mg by mouth daily.   rosuvastatin 10 MG tablet Commonly known as: CRESTOR Take 10 mg by mouth daily. For cholesterol   warfarin 3 MG tablet Commonly known as: COUMADIN Take 1.5-3 mg by mouth See admin instructions. Take 1/2 tablet (1.5 mg) by mouth on Tuesday, Thursday, Saturday mornings, take 1 tablet (3 mg) on Sunday, Monday, Wednesday, Friday mornings      No Known Allergies  Discharge Instructions    Diet - low sodium heart healthy   Complete by: As directed    Increase activity slowly   Complete by: As directed      Discharge Exam: Filed Weights   11/02/18 0217 11/03/18 0550  Weight: 57.1 kg 58.1 kg   Vitals:   11/03/18 0550 11/03/18 1212  BP: (!) 164/69 (!) 152/77  Pulse: 88 81  Resp: 18 20  Temp: 99.4 F (37.4 C) 98.9 F (37.2 C)  SpO2: 92% 96%   General: Appear in no distress, no Rash; Oral Mucosa Clear, moist. no Abnormal Mass Or lumps Cardiovascular: S1 and S2 Present, no Murmur, Respiratory: normal respiratory effort, Bilateral Air entry present and Clear to Auscultation, no Crackles, no wheezes Abdomen: Bowel Sound present, Soft and no tenderness, no hernia Extremities: no Pedal edema, no calf tenderness Neurology: alert and oriented to time, place, and person affect appropriate.  normal without focal findings, mental status, speech normal, alert and oriented x3, PERLA, Motor strength 5/5 and symmetric and sensation grossly normal to light touch   The results of significant diagnostics from this hospitalization (including imaging, microbiology, ancillary and laboratory) are listed below for reference.    Significant Diagnostic Studies: Ct Head Wo Contrast  Result Date: 11/02/2018 CLINICAL DATA:  Encephalopathy. EXAM: CT HEAD WITHOUT CONTRAST TECHNIQUE: Contiguous axial images were obtained from the base of the skull through the vertex without intravenous contrast. COMPARISON:  None. FINDINGS: Brain: No intracranial hemorrhage, mass effect, or midline shift. No hydrocephalus. The basilar cisterns are patent. Age related atrophy. Moderate chronic small vessel ischemia. No evidence of territorial infarct or acute ischemia. No extra-axial or intracranial fluid collection. Vascular: Atherosclerosis of skullbase vasculature without hyperdense vessel or abnormal calcification. Skull: No fracture or focal lesion. Sinuses/Orbits: Minimal mucosal  thickening in the sphenoid sinus. No sinus fluid levels. Orbits are unremarkable. Mastoid air cells are clear. Other: None. IMPRESSION: 1. No acute intracranial abnormality. 2. Age related atrophy with moderate chronic small vessel ischemia. Electronically Signed   By: Narda Rutherford M.D.   On: 11/02/2018 03:53   Dg Chest Portable 1 View  Result Date: 11/01/2018 CLINICAL DATA:  Fever EXAM: PORTABLE CHEST 1 VIEW COMPARISON:  None. FINDINGS: No focal airspace disease or effusion. Mild cardiomegaly with aortic atherosclerosis. No pneumothorax. IMPRESSION: No active disease.  Cardiomegaly Electronically Signed   By: Jasmine Pang M.D.   On: 11/01/2018 23:24    Microbiology: Recent Results (from the past 240 hour(s))  SARS Coronavirus 2 (CEPHEID- Performed in [facility] hospital lab), Hosp Order     Status: None   Collection Time: 11/01/18  8:17 PM   Specimen: Nasopharyngeal Swab  Result Value Ref Range Status   SARS Coronavirus 2 NEGATIVE NEGATIVE Final    Comment: (NOTE) If result is NEGATIVE SARS-CoV-2 target nucleic acids are NOT DETECTED. The SARS-CoV-2 RNA is generally detectable in upper and lower  respiratory specimens during the acute phase of infection. The lowest  concentration of SARS-CoV-2 viral copies this assay can detect is 250  copies / mL. A negative result does not preclude SARS-CoV-2 infection  and should not be used as the sole basis for treatment or other  patient management decisions.  A negative result may occur with  improper specimen collection / handling, submission of specimen other  than nasopharyngeal swab, presence of viral mutation(s) within the  areas targeted by this assay, and inadequate number of viral copies  (<250 copies / mL). A negative result must be combined with clinical  observations, patient history, and epidemiological information. If result is POSITIVE SARS-CoV-2 target nucleic acids are DETECTED. The SARS-CoV-2 RNA is generally detectable in  upper and lower  respiratory specimens dur ing the acute phase of infection.  Positive  results are indicative of active infection with SARS-CoV-2.  Clinical  correlation with patient history and other diagnostic information is  necessary to determine patient infection status.  Positive results do  not rule out bacterial infection or co-infection with other viruses. If result is PRESUMPTIVE POSTIVE SARS-CoV-2 nucleic acids MAY BE PRESENT.   A presumptive positive result was obtained on the submitted specimen  and confirmed on repeat testing.  While 2019 novel coronavirus  (SARS-CoV-2) nucleic acids may be present in the submitted sample  additional confirmatory testing may be necessary for epidemiological  and / or clinical management purposes  to differentiate between  SARS-CoV-2 and other Sarbecovirus currently known to  infect humans.  If clinically indicated additional testing with an alternate test  methodology [phone]) is advised. The SARS-CoV-2 RNA is generally  detectable in upper and lower respiratory sp ecimens during the acute  phase of infection. The expected result is Negative. Fact Sheet for Patients:  StrictlyIdeas.no Fact Sheet for Healthcare Providers: BankingDealers.co.za This test is not yet approved or cleared by the [place] FDA and has been authorized for detection and/or diagnosis of SARS-CoV-2 by FDA under an Emergency Use Authorization (EUA).  This EUA will remain in effect (meaning this test can be used) for the duration of the COVID-19 declaration under Section 564(b)(1) of the Act, 21 U.S.C. section 360bbb-3(b)(1), unless the authorization is terminated or revoked sooner. Performed at [facility], [place] [address], [place], [place] [address]   Blood Culture (routine x 2)     Status: None (Preliminary result)   Collection Time: 11/01/18  8:30 PM   Specimen: BLOOD  Result Value Ref Range Status    Specimen Description BLOOD LEFT ANTECUBITAL  Final   Special Requests   Final    BOTTLES DRAWN AEROBIC AND ANAEROBIC Blood Culture adequate volume   Culture   Final    NO GROWTH 4 DAYS Performed at [facility], [place] [address], [place], [place] [address]    Report Status PENDING  Incomplete  Blood Culture (routine x 2)     Status: None (Preliminary result)   Collection Time: 11/01/18  9:04 PM   Specimen: BLOOD LEFT FOREARM  Result Value Ref Range Status   Specimen Description BLOOD LEFT FOREARM  Final   Special Requests   Final    BOTTLES DRAWN AEROBIC AND ANAEROBIC Blood Culture adequate volume   Culture   Final    NO GROWTH 4 DAYS Performed at [facility], [place] [address], [place], [place] [address]    Report Status PENDING  Incomplete  Culture, Urine     Status: None   Collection Time: 11/02/18  6:01 PM   Specimen: Urine, Clean Catch  Result Value Ref Range Status   Specimen Description URINE, CLEAN CATCH  Final   Special Requests Normal  Final   Culture   Final    NO GROWTH Performed at [facility], [place] [address], [place], [place] [address]    Report Status 11/03/2018 FINAL  Final     Labs: CBC: Recent Labs  Lab 11/01/18 2011 11/02/18 0435  WBC 16.3* 14.9*  NEUTROABS 14.2*  --   HGB 12.0 11.0*  HCT 38.3 35.8*  MCV 87.8 88.0  PLT 232 476   Basic Metabolic Panel: Recent Labs  Lab 11/01/18 2011 11/02/18 0435  NA 134* 141  K 3.9 3.9  CL 102 110  CO2 21* 23  GLUCOSE 132* 115*  BUN 15 12  CREATININE 0.91 0.83  CALCIUM 8.7* 8.5*   Liver Function Tests: Recent Labs  Lab 11/01/18 2011  AST 27  ALT 23  ALKPHOS 114  BILITOT 0.4  PROT 7.0  ALBUMIN 3.6   Recent Labs  Lab 11/01/18 2011  LIPASE 58*   No results for input(s): AMMONIA in the last 168 hours. Cardiac Enzymes: No results for input(s): CKTOTAL, CKMB, CKMBINDEX, TROPONINI in the last 168 hours. BNP (last 3 results) No results for input(s): BNP in the last 8760 hours.  CBG: Recent Labs  Lab 11/02/18 1121 11/02/18 1652 11/02/18 2134 11/03/18 0621 11/03/18 1214  GLUCAP 102* 94 99 84 85   Time spent:  35 minutes  Signed:  Lynden Oxfordranav   [facility] 11/03/2018

## 2021-02-20 IMAGING — CT CT HEAD WITHOUT CONTRAST
3 of 4 series · 13 of 47 positions shown, 15 images · non-contrast
Comparison: None.

CLINICAL DATA: Encephalopathy.

EXAM:
CT HEAD WITHOUT CONTRAST
TECHNIQUE: Contiguous axial images were obtained from the base of the skull
through the vertex without intravenous contrast.

[Series 3: head wo · axial · 0.42mm/px · z∈[-163,-43]mm · 7 of 32 slices shown, 9 images]
[im 4/32  brain]
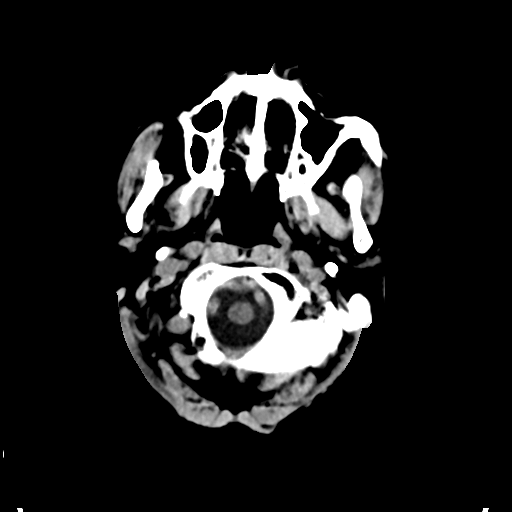
[im 4/32  bone]
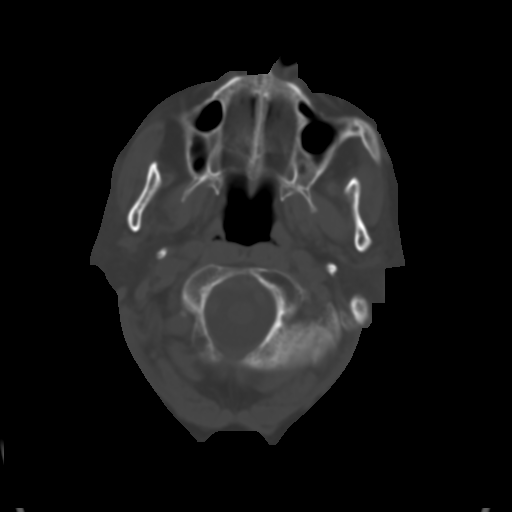
[im 8/32  brain]
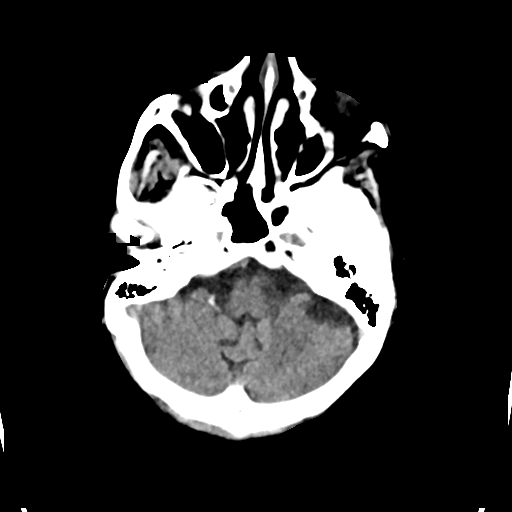
[im 12/32  brain]
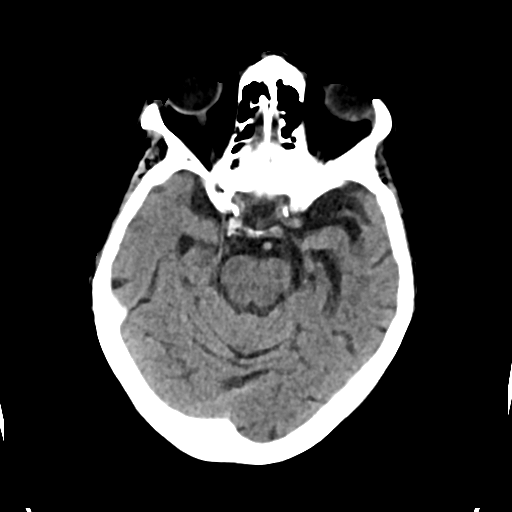
[im 16/32  brain]
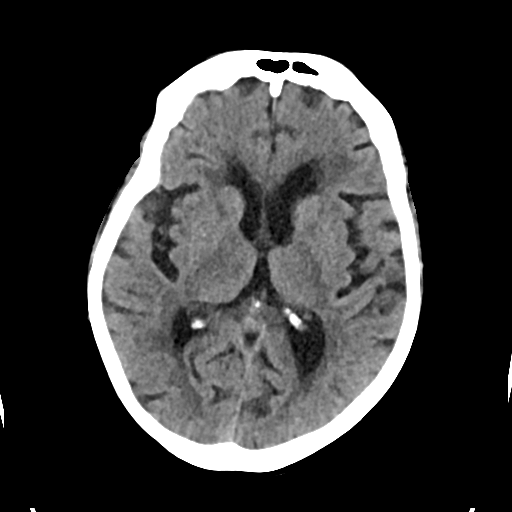
[im 20/32  brain]
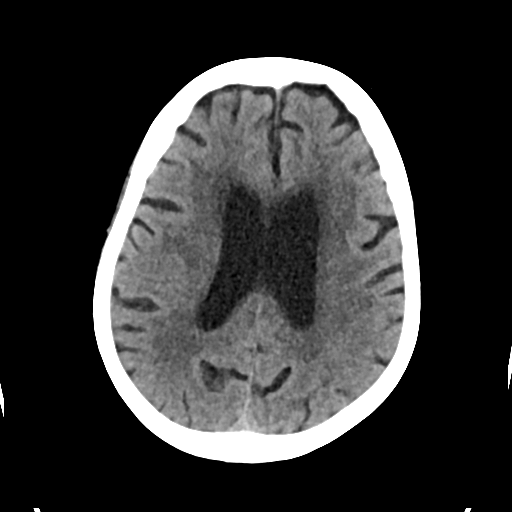
[im 20/32  bone]
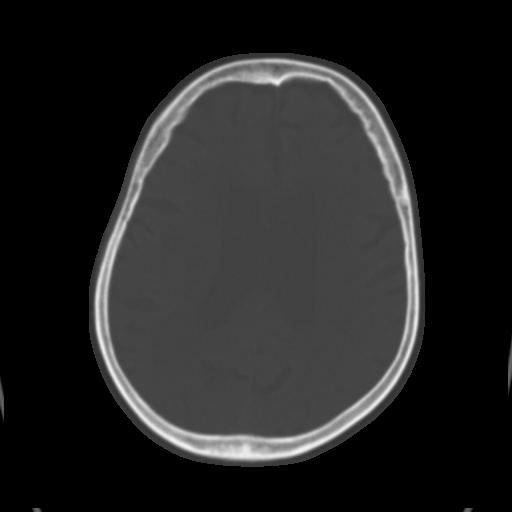
[im 24/32  brain]
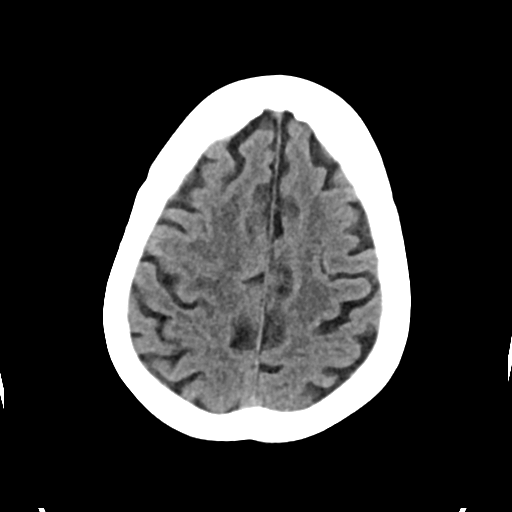
[im 28/32  brain]
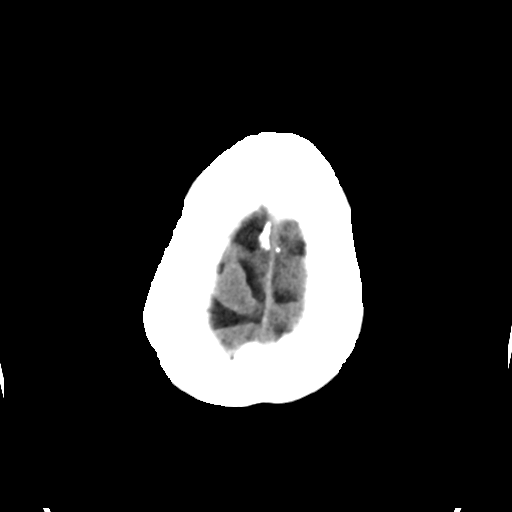

[Series 5: cor soft · coronal · 0.31mm/px · 3 of 73 slices shown]
[im 25/73  brain]
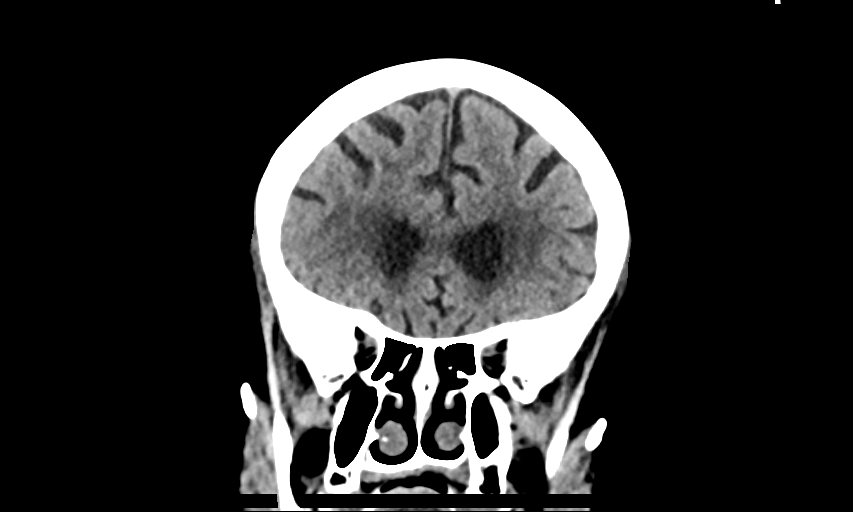
[im 33/73  brain]
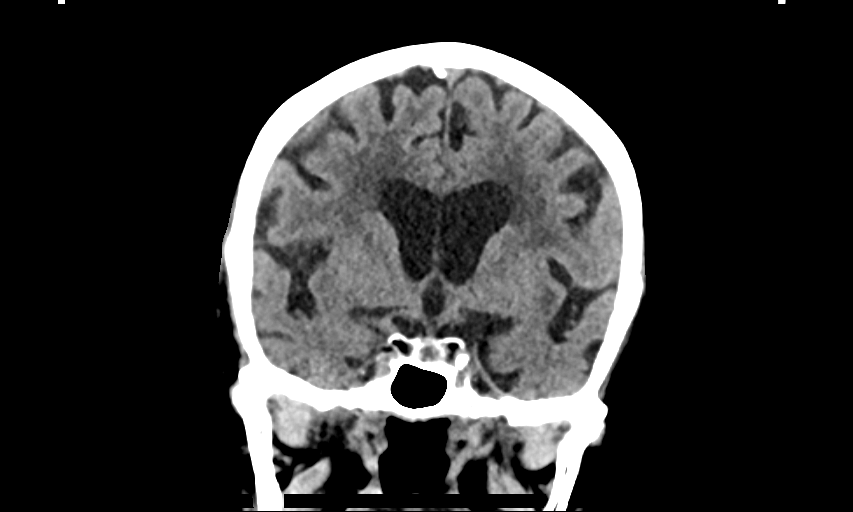
[im 41/73  brain]
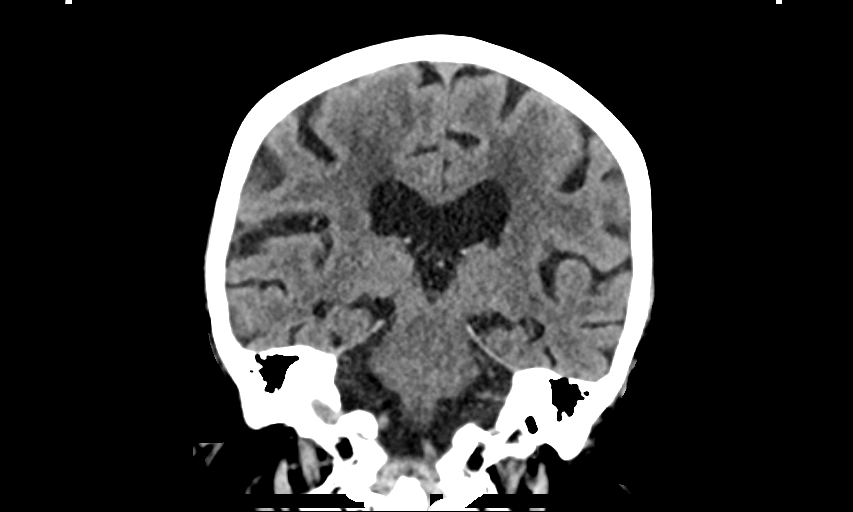

[Series 6: sag soft · sagittal · 0.37mm/px · 3 of 65 slices shown]
[im 22/65  brain]
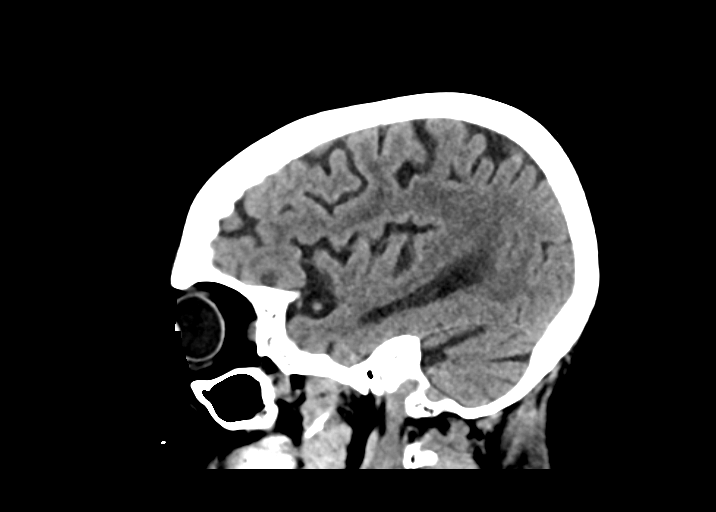
[im 33/65  brain]
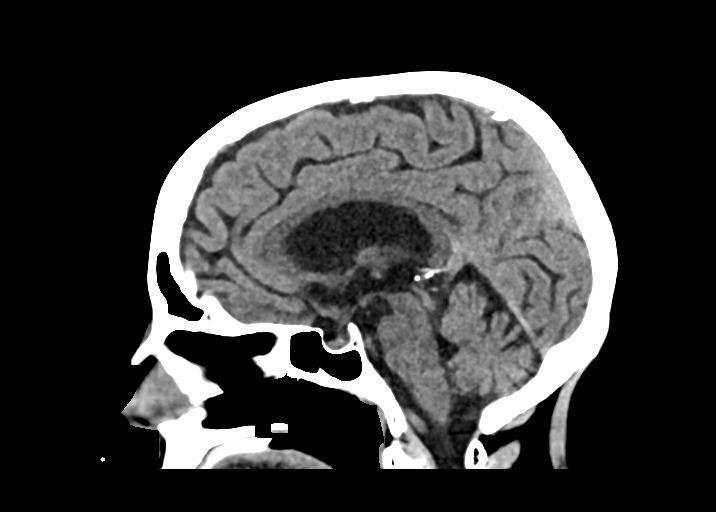
[im 43/65  brain]
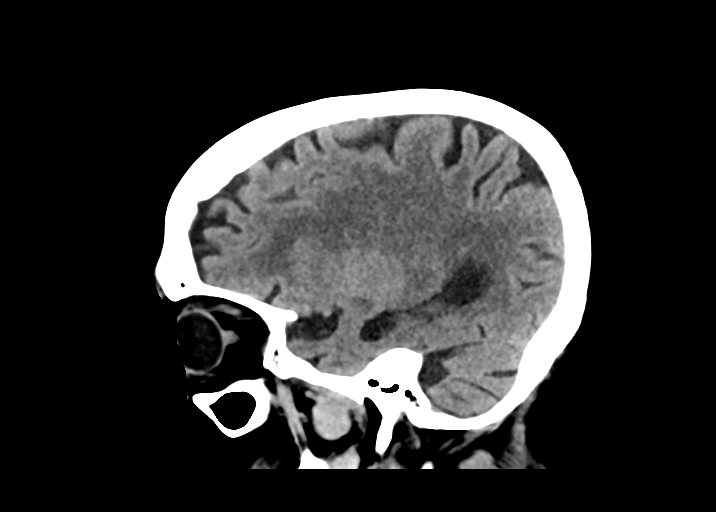

[13 of 47 positions shown; findings below may reference images not displayed]

FINDINGS: Brain: No intracranial hemorrhage, mass effect, or midline shift. No
hydrocephalus. The basilar cisterns are patent. Age related atrophy.
Moderate chronic small vessel ischemia. No evidence of territorial
infarct or acute ischemia. No extra-axial or intracranial fluid
collection.

Vascular: Atherosclerosis of skullbase vasculature without
hyperdense vessel or abnormal calcification.

Skull: No fracture or focal lesion.

Sinuses/Orbits: Minimal mucosal thickening in the sphenoid sinus. No
sinus fluid levels. Orbits are unremarkable. Mastoid air cells are
clear.

Other: None.
IMPRESSION: 1. No acute intracranial abnormality.
2. Age related atrophy with moderate chronic small vessel ischemia.
